# Patient Record
Sex: Female | Born: 1995 | Marital: Single
Health system: Western US, Academic
[De-identification: ages and names within clinical notes are randomized; demographics above are authoritative.]

---

## 2017-06-18 LAB — HIV-1 P24 AG AND HIV-1/2 ABS,
HIV Ag/Ab Combo: NONREACTIVE
HIV Antigen-Antibody INDEX,Ser: 0.46 IDX (ref 0.00–0.99)
HIV-1 Antibody INDEX, Serum: 0.46 IDX (ref 0.00–0.99)
HIV-1 Antibody: NONREACTIVE
HIV-1 Antigen P24 INDEX,Serum: 0.44 IDX (ref 0.00–0.99)
HIV-1 P24 Ag Screen: NONREACTIVE
HIV-2 Antibody INDEX, Serum: 0.24 IDX (ref 0.00–0.99)
HIV-2 Antibody: NONREACTIVE

## 2017-06-18 LAB — POCT URINE PREGNANCY: B-HCG Qualitative, Urine: NEGATIVE

## 2017-06-18 NOTE — Patient Instructions (Signed)
Patient Instructions by Adam PhenixMignon Cameka Rae, MD at 06/18/2017 11:00 AM     Author:  Adam PhenixMignon Analilia Geddis, MD Service:  (none) Author Type:  Resident    Filed:  06/18/2017 11:09 AM Encounter Date:  06/18/2017 Status:  Signed    Editor:  Adam PhenixMignon Arneda Sappington, MD (Resident)         -Pick up prescriptions from pharmacy  -Acne medication can be picked up once insurance is reactivated  -Once insurance reactivated, return for general health visit    ________________________________________________________   [facility]  HEALTHY TIPS    Nutrition goals:   Unless your medical provider has told you otherwise remember these healthy suggestions:    Avoid sweetened beverages including fruit juice.    Eat something nutritious for breakfast every day.    Limit fast food.    Limit portion sizes.    Aim for 5 servings of fruits and vegetables every day.     Activity goals:    Moderate to vigorous physical activity for at least 30-60 minutes per day for all who are able.    Limit screen time (TV, computer, video games) to no more than 2 hours per day and not before bedtime.    Dental:    Everyone over the age of one year should see a dentist every 6 months.    Brush twice a day for healthy teeth and gums.    Safety:    Always wear a seat belt and use a helmet when riding a bike/scooter/skateboard!     Smoke Detectors: Please make sure that your home has a working Electrical engineersmoke detector. Remember to change the battery every October.     Water temperature: Please make sure your water temperature is turned down to low-warm (less than 120 degrees) to prevent burns. If you are in a rental situation, you can ask your landlord to do this.      Windows and Stairs: If you live in a multi-story home, please make sure that you have guards on your windows and gates for your stairs. This is important to protect young children from injuries.     Poison Control: Program the Poison Help Number - (680)132-68431-831-888-4290 - into your home and cell  phones so you have it handy when you need it.  -Call with questions about household products, food and beverages, chemicals in the workplace and home, environmental toxins, drugs and medicine, and animal and insect bites and stings.

## 2017-06-18 NOTE — Progress Notes (Signed)
Progress Notes by Norlene Duel, MD at 06/18/2017 11:00 AM     Author:  Norlene Duel, MD Service:  Adolescent Medicine Author Type:  Physician    Filed:  07/10/2017 11:08 PM Encounter Date:  06/18/2017 Status:  Addendum    Editor:  Norlene Duel, MD (Physician)      Related Notes:  Original Note by Adam Phenix, MD (Resident) filed at 06/18/2017 11:41 AM         Nancy Lam is a 21 y.o. female.     Personal phone contact is [phone]. Messages Ok? Yes    Chief complaint:   Chief Complaint    Patient presents with     Follow-up       Interval History/HPI:  HPI     Started depo and received her first shot here 6 months ago. Then got two additional shots while away at school in [place]. Her last depo shot was in April (at college). She has been unhappy with this form of birth control because of weight gain and irregular bleeding. She has gained 50 lbs in the last six months (196 lbs today and 152 lbs at her last visit 06/2016). She also feels that the weight gain has been due to an unhealthy diet and eating a lot of fried foods at her college in the [place]. She also was not exercising while away at school.     Tazaria has stopped her depo shots and would like to start oral birth control pills. She is also interested in doing STD screening today for a general check. She was last sexually active 6 months ago and has not noticed any unusual symptoms.     Teigen has also noticed intermittent bilateral ankle swelling over the last 2 months (sometimes up to twice a week). She denies any injuries or trauma to the joints. The swelling is sometimes associated with pain but often not. She will put her feet up or go to sleep and it self resolves. She has not noticed any pattern to when the swelling occurs.     She has also used benzoyl peroxide in the past for acne (wants to see if it is covered - happy to pay out of pocket if needed or can wait until her insurance is reactivated).      Review of Systems    Constitutional: Positive for fatigue and unexpected weight change.   HENT: Negative.    Eyes: Negative.    Respiratory: Negative.  Negative for shortness of breath.    Gastrointestinal: Positive for constipation.   Genitourinary: Negative for dysuria, frequency, vaginal bleeding, vaginal discharge and vaginal pain.     Menstrual History: Patient's last menstrual period was 05/30/2017 (approximate).    Past Medical History:   Past Medical History:     Diagnosis  Date    Acne     History of multiple UTIs (01/2013, 03/2013) 04/03/2013    History of pregnancy 04/03/2013     No past surgical history on file.   No family history on file.     Medications:  Outpatient Prescriptions Marked as Taking for the 06/18/17 encounter (Office Visit) with Norlene Duel, MD     Medication  Sig    ibuprofen (ADVIL,MOTRIN) 600 mg tablet Take 1 tablet (600 mg) by mouth every 6 (six) hours as needed (heavy bleeding) .    loratadine (CLARITIN) 10 mg tablet Take 10 mg by mouth daily as needed for allergies .       Allergies:  Patient has no known allergies.     Social Screening:  Home: Living at home for this summer, lives with mom and two sisters.   Education: Started taking classes at [employer] and then [place] [facility] [employer] and was there this year. She was not happy with the program. So she plans to transfer from college in [place] to [employer]. Did well in the second semester but was academic probation during first semester. Wants to be a doctor and do a post-bac program.   Activities: Taking classes at [employer] doing statistics class.   Diet: Increasing green vegetables. Eating less meat. Vegan pizza. Drinking a lot of water. Exercising (running the stairs at [employer]) - is planning to exercise 2x per week. Skips breakfast sometimes.   Drugs: No tob/etoh/drug use       History    Smoking Status     Never Smoker   Smokeless Tobacco     Never Used          CRAFFT pre-screening: If 2 or more is YES, perform  CRAFFT.    Drink any alcohol (more than a few sips )? No    Smoke or eat marijuana more than 4 out of 7 days/week? No    Use anything else to get high? No  Sex: Not currently in a relationship.  LSI 6 months ago      Gender considerations: Female      Sexual orientation considerations: Straight or heterosexual   Suicide/Safety: Mood - "great", no SI/HI      Depression Screen (PHQ-2):       Little interest or pleasure in doing things? 0- Not at all      Feeling down, depressed, or hopeless? 0- Not at all      PHQ-9 needed? No, PHQ-2 score below 3    Objective:   BP 114/76   Pulse 97   Temp 36.8 C (98.2 F)   Ht 167.5 cm   Wt 88.9 kg (196 lb)   LMP 05/30/2017 (Approximate)   Breastfeeding? No   BMI 31.69 kg/m   Pain Score: 0-No pain  Location:    WT: Facility age limit for growth percentiles is 20 years.  HT: Facility age limit for growth percentiles is 20 years.  BMI: Facility age limit for growth percentiles is 20 years., Obese (> or = 95th)  BP: Growth percentile SmartLinks can only be used for patients less than 23 years old., N/A    Physical Exam   Constitutional: She appears well-developed and well-nourished.   HENT:   Right Ear: External ear normal.   Left Ear: External ear normal.   Mouth/Throat: Oropharynx is clear and moist.   Eyes: Conjunctivae are normal.   Cardiovascular: Normal rate, regular rhythm and normal heart sounds.    Pulmonary/Chest: Effort normal and breath sounds normal. She has no wheezes.   Abdominal: Soft. There is no tenderness.   Lymphadenopathy:     She has no cervical adenopathy.   Neurological: She is alert.   Skin: Skin is warm and dry.   Mild acne papules on face   Psychiatric: She has a normal mood and affect.     Labs and screening tests:  Office Visit on 06/18/2017        Component  Date Value Ref Range Status    Urine Beta HCG 06/18/2017 Negative  NEGATIVE Final       Health Education:  Anticipatory Guidance  Diet/nutrition, Confidentiality and  Contraception/EC  Assessment:  Nancy Lam  is a 21 y.o. female who presents today to transition from depo to OCPs and for routine STD screening. She has had a significant weight gain (around 50 lbs) in 6 months which is likely due to a combination of appetite increase with depo and lifestyle changes including eating fried foods and little to no exercise since starting at her new school in [place]. Her insurance has lapsed and she is using FAM-PACT today.     1. Reproductive Health  -counseled on contraception options and risks and benefits of each. She selected OCPs  -routine STD screening (HIV, RPR, CT/GC)  -counseled that OCPs will not protect against STDs and that condoms should also be used    2. Weight Gain  -Counseled on healthy eating habits and exercise for weight loss  -Once insurance is reactivated, would like her to return for weight monitoring and screening labs including cholesterol, electrolytes, possibly TSH    3. Ankle Swelling  -May be secondary to increased weight  -Bilateral symptoms, regularity of symptoms and resolution after elevating legs is reassuring against DVT    4. Acne  -Mild   -Benzoyl peroxide has worked in past for her symptoms      Plan:  Problem List Items Addressed This Visit     Acne    Relevant Medications    benzoyl peroxide 10 % cream    Screening for STDs (sexually transmitted diseases) - Primary    Relevant Orders    Chlamydia trachomatis/Neisseria gonorrhoeae by NAAT - Urine    RPR/Rapid Plasma Reagin, Screen    HIV-1 p24 Ag and HIV-1/2 Abs, 5th Gen, Serum with Reflex      Other Visit Diagnoses     Encounter for initial prescription of contraceptive pills        Relevant Medications    norgestrel-ethinyl estradiol (LO/OVRAL) 0.3-30 mg-mCg per tablet        Reproductive Health: Pill (starting today)  Immunization Status: Up to date.  Dental: N/A    Psychosocial: No issues today. Mood is good.     No Follow-up on file. Encouraged to follow up once insurance is  reactivated to do some routine screening labs following her weight gain.    Adam Phenix, MD  PL-1, pager: [phone]  06/18/2017

## 2017-06-18 NOTE — Addendum Note (Signed)
Addendum Note by Norlene Duelamita Danyella Mcginty, MD at 06/18/2017 11:00 AM     Author:  Norlene Duelamita Jorel Gravlin, MD Service:  Adolescent Medicine Author Type:  Physician    Filed:  07/10/2017 11:08 PM Encounter Date:  06/18/2017 Status:  Signed    Editor:  Norlene Duelamita Sohaib Vereen, MD (Physician)      Addended by: Norlene DuelOBINSON, Loralai Eisman on: 07/10/2017 11:08 PM        Modules accepted: Level of Service

## 2017-06-19 LAB — RPR: RPR: NONREACTIVE

## 2017-06-19 LAB — CHLAMYDIA TRACHOMATIS/NEISSERI
C. trachomatis DNA, Urine: NEGATIVE
N. gonorrhoeae, NAA, Urine: NEGATIVE

## 2017-07-20 NOTE — Progress Notes (Signed)
Progress Notes by Norlene Duelamita Fumiko Cham, MD at 07/20/2017  9:30 AM     Author:  Norlene Duelamita Telia Amundson, MD Service:  Adolescent Medicine Author Type:  Physician    Filed:  08/10/2017 10:20 PM Encounter Date:  07/20/2017 Status:  Signed    Editor:  Norlene Duelamita Solace Wendorff, MD (Physician)         Nancy MichaelisDanielle Lam is a 21 y.o. female.       Chief complaint:   Chief Complaint     Patient presents with      Appointment      RESTART DEPO       Interval History/HPI:  21 yr old female with increased vaginal discharge that is clear, +itching and ammonium order  No vaginal odor  No irregular bleeding, but hasn't had her period  No new soaps or detergents, uses her own soap (arm and hammer sensitive)    Still hasn't had her period and is NOT interested in restarting depo. Did not feel comfortable telling LVN the reason for her visit, so stated it was to restart depo.    LSI 6 months ago    Ankle swelling resolved    Eating less beef  Eating oatmeal, more fruits and vegetables  Seafood 2x/week (fish, shrimp)    Review of Systems   Constitutional: Negative.    Respiratory: Negative.    Cardiovascular: Negative.    Gastrointestinal: Negative for abdominal pain.   Genitourinary: Positive for vaginal discharge. Negative for dysuria, frequency, genital sores and pelvic pain.   All other systems reviewed and are negative.      Menstrual History: No LMP recorded.    Past Medical History:   Past Medical History:     Diagnosis  Date    Acne     History of multiple UTIs (01/2013, 03/2013) 04/03/2013    History of pregnancy 04/03/2013     No past surgical history on file.   No family history on file.     Medications:  No outpatient prescriptions have been marked as taking for the 07/20/17 encounter (Office Visit) with Norlene Duelamita Fleur Audino, MD.       Allergies:  Patient has no known allergies.     Social Screening:  HEADDSSS from 06/18/17 reviewed and unchanged, except as noted below:    Home: Still [place] apartment in [place]  Education: Going back to [employer]  [place], taking classes online at [employer]      Objective:   BP 124/79   Pulse 92   Temp 36.4 C (97.5 F) (Temporal Artery)   Resp 18   Ht 168.5 cm   Wt 87.1 kg (192 lb 0.3 oz)   Breastfeeding? No   BMI 30.68 kg/m   Pain Score: 0-No pain  Location:   WT: Facility age limit for growth percentiles is 20 years.  HT: Facility age limit for growth percentiles is 20 years.  BMI: Facility age limit for growth percentiles is 20 years., Obese (> or = 95th)  BP: Growth percentile SmartLinks can only be used for patients less than 23 years old., Normal        Physical Exam   Gen: Alert, well nourished, well-developed, NAD  CV: RRR w/o murmur  Lungs: CTAB, no w/r/c  Abd: soft, NT/ND, bowel sounds present  GU: self-swabbed  Neuro: grossly non-focal      Assessment:  Nancy Lam  is a 21 y.o. female here with vaginal discharge and pruritus, has candidal vulvovaginits.    Plan:  Problem List Items Addressed  This Visit     None      Visit Diagnoses     Yeast infection    -  Primary  Patients history, physical and wet mount findings (pseudohyphae present, no clue cells or trichomonas) consistent with candidal vulvovaginitis.  Reviewed how to prevent and treat candida.  Pt opted for fluconazole 150 mg PO x 1.  May use clotrimazole 1% topically bid for symptomatic relief if desires.        Relevant Medications    fluconazole (DIFLUCAN) tablet, dose = 150 mg (Completed)    clotrimazole (V-R CLOTRIMAZOLE VAGINAL) 1 % vaginal cream          Reproductive Health: Wants to avoid pregnancy, Plans to use condoms and Pill  Immunization Status: Risks and benefits of vaccines discussed. and Not discussed, address at next visit.  Dental: N/A    No Follow-up on file.    Norlene Duel, MD

## 2017-07-20 NOTE — Patient Instructions (Signed)
Patient Instructions by Norlene Duelamita Ashaad Gaertner, MD at 07/20/2017  9:30 AM     Author:  Norlene Duelamita Anhar Mcdermott, MD Service:  (none) Author Type:  Physician    Filed:  08/10/2017 10:19 PM Encounter Date:  07/20/2017 Status:  Signed    Editor:  Norlene Duelamita Sarahelizabeth Conway, MD (Physician)           ________________________________________________________   [facility]  HEALTHY TIPS    Nutrition goals:   Unless your medical provider has told you otherwise remember these healthy suggestions:    Avoid sweetened beverages including fruit juice.    Eat something nutritious for breakfast every day.    Limit fast food.    Limit portion sizes.    Aim for 5 servings of fruits and vegetables every day.     Activity goals:    Moderate to vigorous physical activity for at least 30-60 minutes per day for all who are able.    Limit screen time (TV, computer, video games) to no more than 2 hours per day and not before bedtime.    Dental:    Everyone over the age of one year should see a dentist every 6 months.    Brush twice a day for healthy teeth and gums.    Safety:    Always wear a seat belt and use a helmet when riding a bike/scooter/skateboard!     Smoke Detectors: Please make sure that your home has a working Electrical engineersmoke detector. Remember to change the battery every October.     Water temperature: Please make sure your water temperature is turned down to low-warm (less than 120 degrees) to prevent burns. If you are in a rental situation, you can ask your landlord to do this.      Windows and Stairs: If you live in a multi-story home, please make sure that you have guards on your windows and gates for your stairs. This is important to protect young children from injuries.     Poison Control: Program the Poison Help Number - 313 209 89701-828-370-3256 - into your home and cell phones so you have it handy when you need it.  -Call with questions about household products, food and beverages, chemicals in the workplace and home,  environmental toxins, drugs and medicine, and animal and insect bites and stings.

## 2017-08-09 DIAGNOSIS — N899 Noninflammatory disorder of vagina, unspecified: Secondary | ICD-10-CM | POA: Diagnosis not present

## 2017-08-16 ENCOUNTER — Emergency Department (HOSPITAL_COMMUNITY)
Admission: EM | Admit: 2017-08-16 | Discharge: 2017-08-16 | Disposition: A | Discharge status: Home / Self Care | Payer: BLUE CROSS/BLUE SHIELD | Attending: Emergency Medicine | Admitting: Emergency Medicine

## 2017-08-16 ENCOUNTER — Emergency Department (HOSPITAL_COMMUNITY): Payer: BLUE CROSS/BLUE SHIELD

## 2017-08-16 ENCOUNTER — Encounter (HOSPITAL_COMMUNITY): Payer: Self-pay | Admitting: Emergency Medicine

## 2017-08-16 DIAGNOSIS — M791 Myalgia, unspecified site: Secondary | ICD-10-CM

## 2017-08-16 DIAGNOSIS — Y9389 Activity, other specified: Secondary | ICD-10-CM | POA: Insufficient documentation

## 2017-08-16 DIAGNOSIS — Y998 Other external cause status: Secondary | ICD-10-CM | POA: Diagnosis not present

## 2017-08-16 DIAGNOSIS — M542 Cervicalgia: Secondary | ICD-10-CM | POA: Diagnosis not present

## 2017-08-16 DIAGNOSIS — Y92410 Unspecified street and highway as the place of occurrence of the external cause: Secondary | ICD-10-CM | POA: Diagnosis not present

## 2017-08-16 MED ORDER — METHOCARBAMOL 500 MG PO TABS: 500.0000 mg | ORAL_TABLET | Freq: Two times a day (BID) | ORAL | 0 refills | Status: DC | PRN

## 2017-08-16 MED ORDER — IBUPROFEN 800 MG PO TABS: 800.0000 mg | ORAL_TABLET | Freq: Three times a day (TID) | ORAL | 0 refills | Status: DC | PRN

## 2017-08-16 MED ORDER — IBUPROFEN 800 MG PO TABS
800.0000 mg | ORAL_TABLET | Freq: Once | ORAL | Status: AC
  Administered 2017-08-16: 800 mg via ORAL
  Filled 2017-08-16: qty 1
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
## 2017-08-22 DIAGNOSIS — Z3009 Encounter for other general counseling and advice on contraception: Secondary | ICD-10-CM | POA: Diagnosis not present

## 2017-09-22 DIAGNOSIS — H52223 Regular astigmatism, bilateral: Secondary | ICD-10-CM | POA: Diagnosis not present

## 2017-10-22 DIAGNOSIS — Z0000 Encounter for general adult medical examination without abnormal findings: Secondary | ICD-10-CM | POA: Diagnosis not present

## 2017-10-23 DIAGNOSIS — Z113 Encounter for screening for infections with a predominantly sexual mode of transmission: Secondary | ICD-10-CM | POA: Diagnosis not present

## 2017-11-04 ENCOUNTER — Emergency Department (HOSPITAL_COMMUNITY)
Admission: EM | Admit: 2017-11-04 | Discharge: 2017-11-04 | Disposition: A | Discharge status: Home / Self Care | Payer: BLUE CROSS/BLUE SHIELD | Attending: Emergency Medicine | Admitting: Emergency Medicine

## 2017-11-04 ENCOUNTER — Other Ambulatory Visit: Payer: Self-pay

## 2017-11-04 ENCOUNTER — Encounter (HOSPITAL_COMMUNITY): Payer: Self-pay | Admitting: *Deleted

## 2017-11-04 DIAGNOSIS — Z79899 Other long term (current) drug therapy: Secondary | ICD-10-CM | POA: Diagnosis not present

## 2017-11-04 DIAGNOSIS — H5712 Ocular pain, left eye: Secondary | ICD-10-CM | POA: Diagnosis present

## 2017-11-04 DIAGNOSIS — H109 Unspecified conjunctivitis: Secondary | ICD-10-CM | POA: Insufficient documentation

## 2017-11-04 MED ORDER — POLYMYXIN B-TRIMETHOPRIM 10000-0.1 UNIT/ML-% OP SOLN: 1.0000 [drp] | OPHTHALMIC | 0 refills | Status: AC

## 2017-11-04 NOTE — Discharge Instructions (Signed)
I suspect he had viral conjunctivitis (pinkeye).  Typical symptoms include eyelid swelling/puffiness, red eye, tearing and crusting.  There may be some pain but is generally more irritation/itching.  Wash your hands frequently.  Use cold compresses.  Continue to take Benadryl as needed.  You were also given a prescription for some antibiotic eyedrops in case of a bacterial cause, but I think this is less likely.

## 2017-11-04 NOTE — ED Provider Notes (Signed)
[facility] Provider Note   CSN: [id number] Arrival date & time: 11/04/17  [id number]     History   Chief Complaint Chief Complaint  Patient presents with  . Eye Pain    HPI Joanna Long is a 21 y.o. female.  HPI   21 year old female with left eye swelling and irritation.  Onset 2 days ago.  No real pain, more irritation/itching.  Tearing and crusting of her eyelids.  She does not wear contacts.  No change in visual acuity. No corrective lenses.  History reviewed. No pertinent past medical history.  There are no active problems to display for this patient.   History reviewed. No pertinent surgical history.  OB History    No data available       Home Medications    Prior to Admission medications   Medication Sig Start Date End Date Taking? Authorizing Provider  ibuprofen (ADVIL,MOTRIN) 800 MG tablet Take 1 tablet (800 mg total) by mouth every 8 (eight) hours as needed. 08/16/17   Ward, Chase PicketJaime Pilcher, PA-C  methocarbamol (ROBAXIN) 500 MG tablet Take 1 tablet (500 mg total) by mouth 2 (two) times daily as needed for muscle spasms. 08/16/17   Ward, Chase PicketJaime Pilcher, PA-C  trimethoprim-polymyxin b (POLYTRIM) ophthalmic solution Place 1 drop into the left eye every 4 (four) hours for 5 days. 11/04/17 11/09/17  Raeford RazorKohut, Amery Minasyan, MD    Family History No family history on file.  Social History Social History   Tobacco Use  . Smoking status: Never Smoker  . Smokeless tobacco: Never Used  Substance Use Topics  . Alcohol use: No    Frequency: Never  . Drug use: No     Allergies   Patient has no known allergies.   Review of Systems Review of Systems  All systems reviewed and negative, other than as noted in HPI.  Physical Exam Updated Vital Signs BP 131/79 (BP Location: Right Arm)   Pulse 82   Temp 98.2 F (36.8 C) (Oral)   Resp 16   Ht 5\' 6"  (1.676 m)   Wt 88.9 kg (196 lb)   LMP 10/26/2017   SpO2 100%   BMI 31.64  kg/m   Physical Exam  Constitutional: She appears well-developed and well-nourished. No distress.  HENT:  Head: Normocephalic and atraumatic.  Eyes: Right eye exhibits no discharge. Left eye exhibits no discharge.  Left eyelid with mild swelling.  Conjunctival injection.  Some crusting noted in the eyelashes.  No proptosis.  No pain with eye movement.  Anterior chambers clear.  Pupils are equally round reactive to light.  Neck: Neck supple.  Cardiovascular: Normal rate, regular rhythm and normal heart sounds. Exam reveals no gallop and no friction rub.  No murmur heard. Pulmonary/Chest: Effort normal and breath sounds normal. No respiratory distress.  Abdominal: Soft. She exhibits no distension. There is no tenderness.  Musculoskeletal: She exhibits no edema or tenderness.  Neurological: She is alert.  Skin: Skin is warm and dry.  Psychiatric: She has a normal mood and affect. Her behavior is normal. Thought content normal.  Nursing note and vitals reviewed.    ED Treatments / Results  Labs (all labs ordered are listed, but only abnormal results are displayed) Labs Reviewed - No data to display  EKG  EKG Interpretation None       Radiology No results found.  Procedures Procedures (including critical care time)  Medications Ordered in ED Medications - No data to display   Initial  Impression / Assessment and Plan / ED Course  I have reviewed the triage vital signs and the nursing notes.  Pertinent labs & imaging results that were available during my care of the patient were reviewed by me and considered in my medical decision making (see chart for details).     21 year old female with likely viral conjunctivitis to left eye.  Will cover with antibiotic drops for possible bacterial conjunctivitis, although I feel this is less likely.  Cool compresses.  Can try taking Benadryl as needed as well.  Return precautions were discussed.  She does not wear corrective  lenses.  Final Clinical Impressions(s) / ED Diagnoses   Final diagnoses:  Conjunctivitis of left eye, unspecified conjunctivitis type    ED Discharge Orders        Ordered    trimethoprim-polymyxin b (POLYTRIM) ophthalmic solution  Every 4 hours     11/04/17 1056       Raeford RazorKohut, Abdelrahman Nair, MD 11/04/17 1102

## 2017-11-04 NOTE — ED Triage Notes (Signed)
Pt with L eye pain, swelling, redness and drainage for several days.

## 2017-11-05 DIAGNOSIS — H1012 Acute atopic conjunctivitis, left eye: Secondary | ICD-10-CM | POA: Diagnosis not present

## 2017-11-22 DIAGNOSIS — N899 Noninflammatory disorder of vagina, unspecified: Secondary | ICD-10-CM | POA: Diagnosis not present

## 2017-11-22 DIAGNOSIS — Z113 Encounter for screening for infections with a predominantly sexual mode of transmission: Secondary | ICD-10-CM | POA: Diagnosis not present

## 2017-12-19 DIAGNOSIS — J029 Acute pharyngitis, unspecified: Secondary | ICD-10-CM | POA: Diagnosis not present

## 2018-01-24 DIAGNOSIS — Z3202 Encounter for pregnancy test, result negative: Secondary | ICD-10-CM | POA: Diagnosis not present

## 2018-01-24 DIAGNOSIS — R1084 Generalized abdominal pain: Secondary | ICD-10-CM | POA: Diagnosis not present

## 2018-01-31 DIAGNOSIS — R350 Frequency of micturition: Secondary | ICD-10-CM | POA: Diagnosis not present

## 2018-01-31 DIAGNOSIS — K5900 Constipation, unspecified: Secondary | ICD-10-CM | POA: Diagnosis not present

## 2018-01-31 DIAGNOSIS — Z113 Encounter for screening for infections with a predominantly sexual mode of transmission: Secondary | ICD-10-CM | POA: Diagnosis not present

## 2018-04-01 DIAGNOSIS — Z113 Encounter for screening for infections with a predominantly sexual mode of transmission: Secondary | ICD-10-CM | POA: Diagnosis not present

## 2018-04-01 DIAGNOSIS — J309 Allergic rhinitis, unspecified: Secondary | ICD-10-CM | POA: Diagnosis not present

## 2018-04-26 DIAGNOSIS — R300 Dysuria: Secondary | ICD-10-CM | POA: Diagnosis not present

## 2018-05-30 DIAGNOSIS — L309 Dermatitis, unspecified: Secondary | ICD-10-CM | POA: Diagnosis not present

## 2018-09-13 DIAGNOSIS — R102 Pelvic and perineal pain: Secondary | ICD-10-CM | POA: Diagnosis not present

## 2018-10-11 DIAGNOSIS — J069 Acute upper respiratory infection, unspecified: Secondary | ICD-10-CM | POA: Diagnosis not present

## 2019-03-17 DIAGNOSIS — Z113 Encounter for screening for infections with a predominantly sexual mode of transmission: Secondary | ICD-10-CM | POA: Diagnosis not present

## 2019-06-06 DIAGNOSIS — N941 Unspecified dyspareunia: Secondary | ICD-10-CM | POA: Diagnosis not present

## 2019-06-06 DIAGNOSIS — Z113 Encounter for screening for infections with a predominantly sexual mode of transmission: Secondary | ICD-10-CM | POA: Diagnosis not present

## 2019-06-06 DIAGNOSIS — B373 Candidiasis of vulva and vagina: Secondary | ICD-10-CM | POA: Diagnosis not present

## 2019-06-06 DIAGNOSIS — Z6824 Body mass index (BMI) 24.0-24.9, adult: Secondary | ICD-10-CM | POA: Diagnosis not present

## 2019-06-06 DIAGNOSIS — Z01419 Encounter for gynecological examination (general) (routine) without abnormal findings: Secondary | ICD-10-CM | POA: Diagnosis not present

## 2019-07-14 DIAGNOSIS — L503 Dermatographic urticaria: Secondary | ICD-10-CM | POA: Diagnosis not present
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
## 2019-09-18 DIAGNOSIS — J3089 Other allergic rhinitis: Secondary | ICD-10-CM | POA: Diagnosis not present

## 2019-09-18 DIAGNOSIS — R21 Rash and other nonspecific skin eruption: Secondary | ICD-10-CM | POA: Diagnosis not present

## 2019-09-18 DIAGNOSIS — J301 Allergic rhinitis due to pollen: Secondary | ICD-10-CM | POA: Diagnosis not present

## 2019-09-18 DIAGNOSIS — J3081 Allergic rhinitis due to animal (cat) (dog) hair and dander: Secondary | ICD-10-CM | POA: Diagnosis not present

## 2019-10-18 DIAGNOSIS — F4321 Adjustment disorder with depressed mood: Secondary | ICD-10-CM | POA: Diagnosis not present

## 2019-10-25 DIAGNOSIS — F4321 Adjustment disorder with depressed mood: Secondary | ICD-10-CM | POA: Diagnosis not present

## 2019-11-15 DIAGNOSIS — F4321 Adjustment disorder with depressed mood: Secondary | ICD-10-CM | POA: Diagnosis not present

## 2020-10-29 ENCOUNTER — Emergency Department (HOSPITAL_COMMUNITY)
Admission: EM | Admit: 2020-10-29 | Discharge: 2020-10-29 | Disposition: A | Discharge status: Home / Self Care | Payer: No Typology Code available for payment source | Attending: Emergency Medicine | Admitting: Emergency Medicine

## 2020-10-29 ENCOUNTER — Encounter (HOSPITAL_COMMUNITY): Payer: Self-pay | Admitting: Emergency Medicine

## 2020-10-29 ENCOUNTER — Other Ambulatory Visit: Payer: Self-pay

## 2020-10-29 ENCOUNTER — Emergency Department (HOSPITAL_COMMUNITY): Payer: No Typology Code available for payment source

## 2020-10-29 DIAGNOSIS — R079 Chest pain, unspecified: Secondary | ICD-10-CM | POA: Diagnosis not present

## 2020-10-29 DIAGNOSIS — M546 Pain in thoracic spine: Secondary | ICD-10-CM

## 2020-10-29 MED ORDER — METHOCARBAMOL 500 MG PO TABS: 500.0000 mg | ORAL_TABLET | Freq: Two times a day (BID) | ORAL | 0 refills | Status: DC

## 2020-10-29 MED ORDER — IBUPROFEN 400 MG PO TABS
600.0000 mg | ORAL_TABLET | Freq: Once | ORAL | Status: AC
  Administered 2020-10-29: 600 mg via ORAL
  Filled 2020-10-29: qty 1

## 2020-10-29 MED ORDER — IBUPROFEN 600 MG PO TABS: 600.0000 mg | ORAL_TABLET | Freq: Four times a day (QID) | ORAL | 0 refills | Status: DC | PRN
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
## 2020-10-29 NOTE — Discharge Instructions (Signed)

## 2020-10-29 NOTE — ED Notes (Signed)
Pt discharge instructions reviewed with the patient. The patient verbalized understanding of instructions. Pt discharged. 

## 2021-07-19 ENCOUNTER — Ambulatory Visit: Payer: No Typology Code available for payment source | Admitting: Podiatry

## 2021-07-20 ENCOUNTER — Ambulatory Visit: Payer: No Typology Code available for payment source | Admitting: Podiatry

## 2021-07-21 ENCOUNTER — Ambulatory Visit: Payer: No Typology Code available for payment source | Admitting: Podiatry

## 2021-07-26 ENCOUNTER — Ambulatory Visit: Payer: No Typology Code available for payment source | Admitting: Podiatry

## 2021-07-29 ENCOUNTER — Telehealth: Payer: Self-pay | Admitting: *Deleted

## 2021-07-29 ENCOUNTER — Ambulatory Visit (INDEPENDENT_AMBULATORY_CARE_PROVIDER_SITE_OTHER): Payer: BC Managed Care – PPO | Admitting: Podiatry

## 2021-07-29 ENCOUNTER — Other Ambulatory Visit: Payer: Self-pay

## 2021-07-29 ENCOUNTER — Encounter: Payer: Self-pay | Admitting: Podiatry

## 2021-07-29 DIAGNOSIS — B351 Tinea unguium: Secondary | ICD-10-CM

## 2021-07-29 DIAGNOSIS — L600 Ingrowing nail: Secondary | ICD-10-CM | POA: Diagnosis not present

## 2021-07-29 NOTE — Telephone Encounter (Signed)
Error

## 2021-07-29 NOTE — Progress Notes (Signed)
Subjective:   Patient ID: Joanna Long, female   DOB: 25 y.o.   MRN: [id number]   HPI 25 year old female presents the office today for concerns of her right big toe, fifth toenails becoming thickened discolored and dark in color.  Is been getting worse over last 2 years.  She said they do cause discomfort at times and she gets some swelling and irritation at the base of the nail and the cuticle.  She tried multiple over-the-counter treatment without significant improvement.  Occasionally her feet also itch.  The nails are brittle she reports.  She has no other concerns.   Review of Systems  All other systems reviewed and are negative.  History reviewed. No pertinent past medical history.  History reviewed. No pertinent surgical history.  No current outpatient medications on file.  Allergies  Allergen Reactions   Soy Allergy Itching    HIVES          Objective:  Physical Exam  General: AAO x3, NAD  Dermatological: Right hallux, fifth digit nails are hypertrophic, dystrophic with darkened discoloration of the nails.  Upon removal of the nails there is no hyperpigmentation in the nail bed or surrounding skin.  Mild edema on the proximal nail fold mostly on the hallux.  No drainage or pus or any erythema.  Vascular: Dorsalis Pedis artery and Posterior Tibial artery pedal pulses are 2/4 bilateral with immedate capillary fill time. There is no pain with calf compression, swelling, warmth, erythema.   Neruologic: Grossly intact via light touch bilateral.   Musculoskeletal: No gross boney pedal deformities bilateral. No pain, crepitus, or limitation noted with foot and ankle range of motion bilateral. Muscular strength 5/5 in all groups tested bilateral.  Gait: Unassisted, Nonantalgic.       Assessment:   Onychodystrophy right hallux, fifth digit nails     Plan:  -Treatment options discussed including all alternatives, risks, and complications -Etiology of symptoms  were discussed -We discussed treatment options for the nails.  She likes to have the nails removed. -At this time, discussed total nail removal without chemical matricectomy to the right first, fifth digit toenails. Risks and complications were discussed with the patient for which they understand and  verbally consent to the procedure. Under sterile conditions a total of 3 mL of a mixture of 2% lidocaine plain and 0.5% Marcaine plain was infiltrated in a digital block fashion. Once anesthetized, the skin was prepped in sterile fashion. A tourniquet was then applied. Next the right first, fifth digit nails were removed in total making sure to remove the entire offending nail borders. Once the nail was removed, the area was debrided and the underlying skin was intact. The area was irrigated and hemostasis was obtained.  A dry sterile dressing was applied. After application of the dressing the tourniquet was removed and there is found to be an immediate capillary refill time to the digit. The patient tolerated the procedure well any complications. Post procedure instructions were discussed the patient for which he verbally understood. Follow-up in one week for nail check or sooner if any problems are to arise. Discussed Long/symptoms of worsening infection and directed to call the office immediately should any occur or go directly to the emergency room. In the meantime, encouraged to call the office with any questions, concerns, changes symptoms. -Nails sent to [facility] for culture/pathology  Vivi Barrack DPM

## 2021-07-29 NOTE — Patient Instructions (Signed)

## 2021-08-02 NOTE — Addendum Note (Signed)
Addended by: Hadley Pen R on: 08/02/2021 08:26 AM   Modules accepted: Orders

## 2021-08-19 ENCOUNTER — Encounter: Payer: Self-pay | Admitting: Podiatry

## 2021-08-19 ENCOUNTER — Other Ambulatory Visit: Payer: Self-pay

## 2021-08-19 ENCOUNTER — Ambulatory Visit (INDEPENDENT_AMBULATORY_CARE_PROVIDER_SITE_OTHER): Payer: BC Managed Care – PPO | Admitting: Podiatry

## 2021-08-19 DIAGNOSIS — B351 Tinea unguium: Secondary | ICD-10-CM

## 2021-08-19 DIAGNOSIS — L600 Ingrowing nail: Secondary | ICD-10-CM | POA: Diagnosis not present

## 2021-08-19 MED ORDER — EFINACONAZOLE 10 % EX SOLN: 1.0000 [drp] | Freq: Every day | CUTANEOUS | 11 refills | Status: AC

## 2021-08-19 NOTE — Patient Instructions (Signed)
Efinaconazole Topical Solution What is this medication? EFINACONAZOLE (e FEE na KON a zole) is an antifungal medicine. It is used to treat certain kinds of fungal infections of the toenail. This medicine may be used for other purposes; ask your health care provider or pharmacist if you have questions. COMMON BRAND NAME(S): JUBLIA What should I tell my care team before I take this medication? They need to know if you have any of these conditions: an unusual or allergic reaction to efinaconazole, other medicines, foods, dyes or preservatives pregnant or trying to get pregnant breast-feeding How should I use this medication? This medicine is for external use only. Do not take by mouth. Follow the directions on the label. Wash hands before and after use. Apply this medicine using the provided brush to cover the entire toenail. Do not use your medicine more often than directed. Finish the full course prescribed by your doctor or health care professional even if you think your condition is better. Do not stop using except on the advice of your doctor or health care professional. Talk to your pediatrician regarding the use of this medicine in children. While this drug may be prescribed for children as young as 6 years for selected conditions, precautions do apply. Overdosage: If you think you have taken too much of this medicine contact a poison control center or emergency room at once. NOTE: This medicine is only for you. Do not share this medicine with others. What if I miss a dose? If you miss a dose, use it as soon as you can. If it is almost time for your next dose, use only that dose. Do not use double or extra doses. What may interact with this medication? Interactions have not been studied. Do not use any other nail products (i.e., nail polish, pedicures) during treatment with this medicine. This list may not describe all possible interactions. Give your health care provider a list of all the  medicines, herbs, non-prescription drugs, or dietary supplements you use. Also tell them if you smoke, drink alcohol, or use illegal drugs. Some items may interact with your medicine. What should I watch for while using this medication? Do not get this medicine in your eyes. If you do, rinse out with plenty of cool tap water. Tell your doctor or health care professional if your symptoms do not start to get better or if they get worse. Wait for at least 10 minutes after bathing before applying this medication. After bathing, make sure that your feet are very dry. Fungal infections like moist conditions. Do not walk around barefoot. To help prevent reinfection, wear freshly washed cotton, not synthetic clothing. Tell your doctor or health care professional if you develop sores or blisters that do not heal properly. If your nail infection returns after you stop using this medicine, contact your doctor or health care professional. What side effects may I notice from receiving this medication? Side effects that you should report to your doctor or health care professional as soon as possible: allergic reactions like skin rash, itching or hives, swelling of the face, lips, or tongue ingrown toenail Side effects that usually do not require medical attention (report to your doctor or health care professional if they continue or are bothersome): mild skin irritation, burning, or itching This list may not describe all possible side effects. Call your doctor for medical advice about side effects. You may report side effects to FDA at 1-800-FDA-1088. Where should I keep my medication? Keep out of the   reach of children. Store at room temperature between 20 and 25 degrees C (68 and 77 degrees F). Keep this medicine in the original container. Throw away any unused medicine after the expiration date. This medicine is flammable. Avoid exposure to heat, fire, flame, and smoking. NOTE: This sheet is a summary. It may  not cover all possible information. If you have questions about this medicine, talk to your doctor, pharmacist, or health care provider.  2022 Elsevier/Gold Standard (2019-04-07 16:14:11)  

## 2021-08-24 DIAGNOSIS — B351 Tinea unguium: Secondary | ICD-10-CM | POA: Insufficient documentation

## 2021-08-24 NOTE — Progress Notes (Signed)
Subjective: 25 year old female presents to the office today for further evaluation after undergoing right first and fifth digit toenail no avulsions.  She states the areas have dried up and not having any pain swelling redness or any drainage.  She has no concerns otherwise.  Objective: AAO x3, NAD DP/PT pulses palpable bilaterally, CRT less than 3 seconds Status post right first and fifth digit toenail avulsions which are healed.  No open lesions.  No edema, erythema, drainage or concerns of infection.  Remaining nails also appear to be dystrophic with yellow and brown discoloration but no pain.  No swelling redness or drainage. No pain with calf compression, swelling, warmth, erythema  Assessment: Onychomycosis  Plan: -All treatment options discussed with the patient including all alternatives, risks, complications.  -From a procedure standpoint she is healed.  We discussed the nail culture, biopsy results which did reveal onychomycosis.  After discussion we discussed oral and topical treatments.  She wants to start with topical.  We will try for Jublia which I ordered today.  Discussed success rates, side effects and duration of use. -Patient encouraged to call the office with any questions, concerns, change in symptoms.   Vivi Barrack DPM

## 2022-04-17 ENCOUNTER — Encounter (HOSPITAL_BASED_OUTPATIENT_CLINIC_OR_DEPARTMENT_OTHER): Payer: Self-pay

## 2022-04-17 ENCOUNTER — Other Ambulatory Visit: Payer: Self-pay

## 2022-04-17 ENCOUNTER — Emergency Department (HOSPITAL_BASED_OUTPATIENT_CLINIC_OR_DEPARTMENT_OTHER): Payer: BC Managed Care – PPO

## 2022-04-17 ENCOUNTER — Emergency Department (HOSPITAL_BASED_OUTPATIENT_CLINIC_OR_DEPARTMENT_OTHER)
Admission: EM | Admit: 2022-04-17 | Discharge: 2022-04-17 | Disposition: A | Discharge status: Home / Self Care | Payer: BC Managed Care – PPO | Attending: Emergency Medicine | Admitting: Emergency Medicine

## 2022-04-17 DIAGNOSIS — K8020 Calculus of gallbladder without cholecystitis without obstruction: Secondary | ICD-10-CM

## 2022-04-17 DIAGNOSIS — R1011 Right upper quadrant pain: Secondary | ICD-10-CM | POA: Diagnosis present

## 2022-04-17 LAB — URINALYSIS, ROUTINE W REFLEX MICROSCOPIC
Bilirubin Urine: NEGATIVE
Glucose, UA: NEGATIVE mg/dL
Hgb urine dipstick: NEGATIVE
Ketones, ur: NEGATIVE mg/dL
Leukocytes,Ua: NEGATIVE
Nitrite: NEGATIVE
Protein, ur: NEGATIVE mg/dL
Specific Gravity, Urine: 1.013 (ref 1.005–1.030)
pH: 5.5 (ref 5.0–8.0)

## 2022-04-17 LAB — CBC WITH DIFFERENTIAL/PLATELET
Abs Immature Granulocytes: 0.02 10*3/uL (ref 0.00–0.07)
Basophils Absolute: 0 10*3/uL (ref 0.0–0.1)
Basophils Relative: 0 %
Eosinophils Absolute: 0.1 10*3/uL (ref 0.0–0.5)
Eosinophils Relative: 1 %
HCT: 38 % (ref 36.0–46.0)
Hemoglobin: 12.1 g/dL (ref 12.0–15.0)
Immature Granulocytes: 0 %
Lymphocytes Relative: 35 %
Lymphs Abs: 3.2 10*3/uL (ref 0.7–4.0)
MCH: 27.1 pg (ref 26.0–34.0)
MCHC: 31.8 g/dL (ref 30.0–36.0)
MCV: 85 fL (ref 80.0–100.0)
Monocytes Absolute: 0.5 10*3/uL (ref 0.1–1.0)
Monocytes Relative: 6 %
Neutro Abs: 5.2 10*3/uL (ref 1.7–7.7)
Neutrophils Relative %: 58 %
Platelets: 188 10*3/uL (ref 150–400)
RBC: 4.47 MIL/uL (ref 3.87–5.11)
RDW: 14.5 % (ref 11.5–15.5)
WBC: 9 10*3/uL (ref 4.0–10.5)
nRBC: 0 % (ref 0.0–0.2)

## 2022-04-17 LAB — COMPREHENSIVE METABOLIC PANEL
ALT: 9 U/L (ref 0–44)
AST: 14 U/L — ABNORMAL LOW (ref 15–41)
Albumin: 4.4 g/dL (ref 3.5–5.0)
Alkaline Phosphatase: 64 U/L (ref 38–126)
Anion gap: 9 (ref 5–15)
BUN: 15 mg/dL (ref 6–20)
CO2: 25 mmol/L (ref 22–32)
Calcium: 9.5 mg/dL (ref 8.9–10.3)
Chloride: 102 mmol/L (ref 98–111)
Creatinine, Ser: 0.77 mg/dL (ref 0.44–1.00)
GFR, Estimated: >60 mL/min (ref >60)
Glucose, Bld: 94 mg/dL (ref 70–99)
Potassium: 4 mmol/L (ref 3.5–5.1)
Sodium: 136 mmol/L (ref 135–145)
Total Bilirubin: 0.2 mg/dL — ABNORMAL LOW (ref 0.3–1.2)
Total Protein: 7.8 g/dL (ref 6.5–8.1)

## 2022-04-17 LAB — PREGNANCY, URINE: Preg Test, Ur: NEGATIVE

## 2022-04-17 LAB — LIPASE, BLOOD: Lipase: 25 U/L (ref 11–51)

## 2022-04-17 MED ORDER — ONDANSETRON HCL 4 MG/2ML IJ SOLN
4.0000 mg | Freq: Once | INTRAMUSCULAR | Status: AC
Start: 2022-04-17 — End: 2022-04-17
  Administered 2022-04-17: 4 mg via INTRAVENOUS
  Filled 2022-04-17: qty 2

## 2022-04-17 MED ORDER — KETOROLAC TROMETHAMINE 15 MG/ML IJ SOLN
15.0000 mg | Freq: Once | INTRAMUSCULAR | Status: AC
  Administered 2022-04-17: 15 mg via INTRAVENOUS
  Filled 2022-04-17: qty 1

## 2022-04-17 MED ORDER — MORPHINE SULFATE (PF) 4 MG/ML IV SOLN
4.0000 mg | Freq: Once | INTRAVENOUS | Status: AC
  Administered 2022-04-17: 4 mg via INTRAVENOUS
  Filled 2022-04-17: qty 1

## 2022-04-17 MED ORDER — IBUPROFEN 600 MG PO TABS: 600.0000 mg | ORAL_TABLET | Freq: Four times a day (QID) | ORAL | 0 refills | Status: AC | PRN

## 2022-04-17 NOTE — ED Notes (Signed)
US at the Bedside. °

## 2022-04-17 NOTE — ED Provider Notes (Signed)
[facility] EMERGENCY DEPT Provider Note   CSN: [id number] Arrival date & time: 04/17/22  1950     History  Chief Complaint  Patient presents with   Abdominal Pain    Joanna Long is a 26 y.o. female.   Abdominal Pain Associated symptoms: nausea   Associated symptoms: no chest pain, no chills, no cough, no diarrhea, no dysuria, no fever, no shortness of breath and no vomiting    26 year old female presents emergency department with 2 to 3-day history of right-sided abdominal pain.  Patient states that pain began Saturday night but she does not member what she was doing when the pain began.  Pain was minimal then but then worsened from Sunday on.  This current episode has lasted for the last hours.  Patient describes the pain as sharp and stabbing in her right upper quadrant with radiation to her right lower back.  She has associated nausea.  Pain is exacerbated with leaning back.  Pain is relieved with rest.  She has tried [employer] as well as [employer] on her skin with minimal to no relief.  She denies chest pain, shortness of breath, vomiting/diarrhea, urinary/vaginal symptoms, change in bowel habits, hematochezia, melena.  She has no significant past medical history  Home Medications Prior to Admission medications   Medication Sig Start Date End Date Taking? Authorizing Provider  ibuprofen (ADVIL) 600 MG tablet Take 1 tablet (600 mg total) by mouth every 6 (six) hours as needed. 04/17/22  Yes Dion Saucier A, PA  Efinaconazole 10 % SOLN Apply 1 drop topically daily. 08/19/21   Trula Slade, DPM      Allergies    Soy allergy    Review of Systems   Review of Systems  Constitutional:  Negative for chills and fever.  Respiratory:  Negative for cough and shortness of breath.   Cardiovascular:  Negative for chest pain and palpitations.  Gastrointestinal:  Positive for abdominal pain and nausea. Negative for blood in stool, diarrhea and vomiting.   Genitourinary:  Negative for dysuria and urgency.  Musculoskeletal:  Negative for arthralgias.  Skin:  Negative for color change, pallor, rash and wound.  Neurological:  Negative for dizziness and seizures.  All other systems reviewed and are negative.  Physical Exam Updated Vital Signs BP 131/76 (BP Location: Right Arm)   Pulse 96   Temp 98.2 F (36.8 C) (Oral)   Resp 18   Ht 5\' 6"  (1.676 m)   Wt 93 kg   LMP 04/08/2022 (Exact Date) Comment: negative U-preg on 04/17/22  SpO2 100%   BMI 33.09 kg/m  Physical Exam Constitutional:      General: She is not in acute distress.    Appearance: She is well-developed. She is obese. She is not ill-appearing, toxic-appearing or diaphoretic.  HENT:     Head: Normocephalic and atraumatic.     Mouth/Throat:     Mouth: Mucous membranes are moist.     Pharynx: Oropharynx is clear.  Eyes:     Extraocular Movements: Extraocular movements intact.  Cardiovascular:     Rate and Rhythm: Normal rate and regular rhythm.     Heart sounds: Normal heart sounds.  Pulmonary:     Effort: Pulmonary effort is normal.     Breath sounds: Normal breath sounds.  Abdominal:     General: Abdomen is flat and protuberant. Bowel sounds are normal. There is no distension.     Palpations: Abdomen is soft. There is no mass.  Tenderness: There is abdominal tenderness. There is right CVA tenderness and guarding. There is no rebound. Positive signs include Murphy's sign. Negative signs include Rovsing's sign, McBurney's sign and psoas sign.     Hernia: No hernia is present.  Skin:    General: Skin is warm and dry.     Capillary Refill: Capillary refill takes less than 2 seconds.  Neurological:     General: No focal deficit present.     Mental Status: She is alert and oriented to person, place, and time.  Psychiatric:        Mood and Affect: Mood normal.        Behavior: Behavior normal.    ED Results / Procedures / Treatments   Labs (all labs ordered are  listed, but only abnormal results are displayed) Labs Reviewed  URINALYSIS, ROUTINE W REFLEX MICROSCOPIC - Abnormal; Notable for the following components:      Result Value   Color, Urine COLORLESS (*)    All other components within normal limits  COMPREHENSIVE METABOLIC PANEL - Abnormal; Notable for the following components:   AST 14 (*)    Total Bilirubin 0.2 (*)    All other components within normal limits  PREGNANCY, URINE  CBC WITH DIFFERENTIAL/PLATELET  LIPASE, BLOOD    EKG None  Radiology CT Renal Stone Study  Result Date: 04/17/2022 CLINICAL DATA:  Right flank pain.  Concern for kidney stone. EXAM: CT ABDOMEN AND PELVIS WITHOUT CONTRAST TECHNIQUE: Multidetector CT imaging of the abdomen and pelvis was performed following the standard protocol without IV contrast. RADIATION DOSE REDUCTION: This exam was performed according to the departmental dose-optimization program which includes automated exposure control, adjustment of the mA and/or kV according to patient size and/or use of iterative reconstruction technique. COMPARISON:  Right upper quadrant ultrasound dated 04/17/2022. FINDINGS: Evaluation of this exam is limited in the absence of intravenous contrast. Lower chest: The visualized lung bases are clear. No intra-abdominal free air or free fluid. Hepatobiliary: The liver is unremarkable. No intrahepatic biliary dilatation. Tiny gallstone. No pericholecystic fluid or evidence of acute cholecystitis by CT. Pancreas: Unremarkable. No pancreatic ductal dilatation or surrounding inflammatory changes. Spleen: Normal in size without focal abnormality. Adrenals/Urinary Tract: The adrenal glands are unremarkable. The kidneys, visualized ureters, and urinary bladder appear unremarkable. Stomach/Bowel: Moderate stool throughout the colon. Several diverticula in the ascending colon without active inflammatory changes. There is no bowel obstruction or active inflammation. The appendix is normal.  Vascular/Lymphatic: The abdominal aorta and IVC are unremarkable. No portal venous gas. There is no adenopathy. Reproductive: The uterus is retroflexed.  No adnexal masses. Other: None Musculoskeletal: No acute or significant osseous findings. IMPRESSION: 1. No acute intra-abdominal or pelvic pathology. No hydronephrosis or nephrolithiasis. 2. Cholelithiasis. 3. Moderate colonic stool burden. No bowel obstruction. Normal appendix. Electronically Signed   By: Anner Crete M.D.   On: 04/17/2022 22:19   US Abdomen Limited RUQ (LIVER/GB)  Result Date: 04/17/2022 CLINICAL DATA:  Initial evaluation for acute right upper quadrant pain. EXAM: ULTRASOUND ABDOMEN LIMITED RIGHT UPPER QUADRANT COMPARISON:  None available. FINDINGS: Gallbladder: 5 mm echogenic stone seen at the gallbladder neck. Gallbladder wall measures within normal limits at 2.7 mm. No free pericholecystic fluid. No sonographic Murphy sign elicited on exam. Common bile duct: Diameter: 1.3 mm Liver: No focal lesion identified. Increased echogenicity seen within the hepatic parenchyma. Portal vein is patent on color Doppler imaging with normal direction of blood flow towards the liver. Other: None. IMPRESSION: 1. Cholelithiasis. No sonographic  features to suggest acute cholecystitis. 2. No biliary dilatation. 3. Increased echogenicity within the hepatic parenchyma, suggesting steatosis. Electronically Signed   By: Jeannine Boga M.D.   On: 04/17/2022 21:03    Procedures Procedures    Medications Ordered in ED Medications  morphine (PF) 4 MG/ML injection 4 mg (4 mg Intravenous Given 04/17/22 2031)  ondansetron (ZOFRAN) injection 4 mg (4 mg Intravenous Given 04/17/22 2031)  morphine (PF) 4 MG/ML injection 4 mg (4 mg Intravenous Given 04/17/22 2225)  ketorolac (TORADOL) 15 MG/ML injection 15 mg (15 mg Intravenous Given 04/17/22 2227)    ED Course/ Medical Decision Making/ A&P                           Medical Decision Making Amount and/or  Complexity of Data Reviewed Labs: ordered. Radiology: ordered.  Risk Prescription drug management.   This patient presents to the ED for concern of abdominal pain, this involves an extensive number of treatment options, and is a complaint that carries with it a high risk of complications and morbidity.  The differential diagnosis includes cholecystitis, pancreatitis, hepatitis, myocardial infarction, ascending cholangitis, appendicitis, ovarian torsion   Co morbidities that complicate the patient evaluation  Obesity    Lab Tests:  I Ordered, and personally interpreted labs.  The pertinent results include: No acute abnormalities on lab test ordered   Imaging Studies ordered:  I ordered imaging studies including right upper quadrant ultrasound and CT abdomen pelvis without contrast I independently visualized and interpreted imaging which showed  Right upper quadrant ultrasound: 5 mm echogenic stone found at the bladder neck, hepatic parenchymal changes suggestive of steatosis CT abdomen pelvis: Cholelithiasis and moderate stool burden. I agree with the radiologist interpretation   Cardiac Monitoring: / EKG:  The patient was maintained on a cardiac monitor.  I personally viewed and interpreted the cardiac monitored which showed an underlying rhythm of: Normal sinus rhythm   Consultations Obtained:  N/a   Problem List / ED Course / Critical interventions / Medication management  Cholelithiasis I ordered medication including  Morphine for pain x2 Toradol for pain Zofran for nausea  Reevaluation of the patient after these medicines showed that the patient improved I have reviewed the patients home medicines and have made adjustments as needed   Test / Admission - Considered:  Cholelithiasis Vital signs within normal range and stable throughout visit Laboratory studies insignificant for any acute pathology Imaging studies significant for cholelithiasis as an acute  process.  Patient was educated regarding pathology of disease process.  She acknowledge understanding.  I discussed with her potential treatment options and she elected to be discharged with adequate pain control.  She denies narcotic medications to be prescribed so I prescribed ibuprofen as needed for pain.  I also recommended to get surgery involved in her case if pain cannot be controlled but she declined.  She elected for discharge when given all the options. I discussed at length with the patient regarding incidental finding of hepatic steatosis changes on ultrasound.  She seems most concerned with these findings.  I recommended close follow-up with her PCP/GI doctor for further management of the disease process. Worrisome signs and symptoms were discussed with the patient and the patient knowledge understanding to return to the emergency department if noticed.  Patient was stable upon discharge.         Final Clinical Impression(s) / ED Diagnoses Final diagnoses:  Calculus of gallbladder without cholecystitis without obstruction  Rx / DC Orders ED Discharge Orders          Ordered    ibuprofen (ADVIL) 600 MG tablet  Every 6 hours PRN        04/17/22 2312              Wilnette Kales, [place] 04/18/22 0018    Luna Fuse, MD 04/28/22 2243

## 2022-04-17 NOTE — ED Notes (Signed)
PA at the Bedside. ?

## 2022-04-17 NOTE — ED Triage Notes (Signed)
Pt reports severe abdominal pain on rt.side that radiates to her rt. Flank. ?+ nausea  ?Denies vomiting or diarrhea ?

## 2022-04-17 NOTE — Discharge Instructions (Addendum)
I will attach information regarding gallbladder stones (cholelithiasis).  Also attach information for hepatic steatosis.  I sent in some medicine to take as needed for pain into [facility].  Look for the worrisome signs and symptoms we discussed and please do not hesitate to return to the emergency department if noticed. ?

## 2022-07-31 ENCOUNTER — Ambulatory Visit: Payer: Self-pay | Admitting: Podiatry

## 2023-01-18 ENCOUNTER — Ambulatory Visit
Admission: EM | Admit: 2023-01-18 | Discharge: 2023-01-18 | Disposition: A | Discharge status: Home / Self Care | Payer: BC Managed Care – PPO | Attending: Urgent Care | Admitting: Urgent Care

## 2023-01-18 DIAGNOSIS — L03213 Periorbital cellulitis: Secondary | ICD-10-CM

## 2023-01-18 MED ORDER — CEFDINIR 300 MG PO CAPS
300.0000 mg | ORAL_CAPSULE | Freq: Two times a day (BID) | ORAL | 0 refills | Status: AC
Start: 2023-01-18 — End: ?

## 2023-01-18 MED ORDER — FLUCONAZOLE 150 MG PO TABS: 150.0000 mg | ORAL_TABLET | ORAL | 0 refills | Status: AC

## 2023-01-18 NOTE — ED Triage Notes (Signed)
Pt c/o redness, swelling, pain to right upper eyelid x 2-3 hours-states pain started while eating-NAD-steady gait

## 2023-01-18 NOTE — ED Provider Notes (Signed)
  [facility]  Note:  This document was prepared using Systems analyst and may include unintentional dictation errors.  MRN: [id number] DOB: 28-Jan-1996  Subjective:   Joanna Long is a 27 y.o. female presenting for 1 day history of acute onset persistent right eyelid pain with swelling.  No vision changes, eye trauma, photophobia.  No itching, rashes.  No current facility-administered medications for this encounter.  Current Outpatient Medications:    Efinaconazole 10 % SOLN, Apply 1 drop topically daily., Disp: 4 mL, Rfl: 11   ibuprofen (ADVIL) 600 MG tablet, Take 1 tablet (600 mg total) by mouth every 6 (six) hours as needed., Disp: 30 tablet, Rfl: 0   Allergies  Allergen Reactions   Soy Allergy Itching    HIVES    History reviewed. No pertinent past medical history.   History reviewed. No pertinent surgical history.  No family history on file.  Social History   Tobacco Use   Smoking status: Never   Smokeless tobacco: Never  Vaping Use   Vaping Use: Never used  Substance Use Topics   Alcohol use: No   Drug use: No    ROS   Objective:   Vitals: BP 121/81 (BP Location: Left Arm)   Pulse 83   Temp 98.9 F (37.2 C) (Oral)   Resp 18   LMP 01/06/2023   SpO2 98%   Physical Exam Constitutional:      General: She is not in acute distress.    Appearance: Normal appearance. She is well-developed. She is not ill-appearing, toxic-appearing or diaphoretic.  HENT:     Head: Normocephalic and atraumatic.     Nose: Nose normal.     Mouth/Throat:     Mouth: Mucous membranes are moist.  Eyes:     General: Lids are everted, no foreign bodies appreciated. Vision grossly intact. No scleral icterus.       Right eye: No foreign body, discharge or hordeolum.        Left eye: No foreign body, discharge or hordeolum.     Extraocular Movements: Extraocular movements intact.     Right eye: Normal extraocular motion.      Left eye: Normal extraocular motion and no nystagmus.     Conjunctiva/sclera:     Right eye: Right conjunctiva is not injected. No chemosis, exudate or hemorrhage.    Left eye: Left conjunctiva is not injected. No chemosis, exudate or hemorrhage.  Cardiovascular:     Rate and Rhythm: Normal rate.  Pulmonary:     Effort: Pulmonary effort is normal.  Skin:    General: Skin is warm and dry.  Neurological:     General: No focal deficit present.     Mental Status: She is alert and oriented to person, place, and time.  Psychiatric:        Mood and Affect: Mood normal.        Behavior: Behavior normal.     Assessment and Plan :   PDMP not reviewed this encounter.  1. Preseptal cellulitis of right upper eyelid     Physical exam findings consistent with preseptal cellulitis and recommended antibiotic use for this.  She gets yeast infections with antibiotic use and therefore will use fluconazole to address that.  Ibuprofen for pain relief. Counseled patient on potential for adverse effects with medications prescribed/recommended today, ER and return-to-clinic precautions discussed, patient verbalized understanding.    Jaynee Eagles, [place] 01/19/23 [phone]

## 2024-01-09 ENCOUNTER — Encounter: Payer: Self-pay | Admitting: Nurse Practitioner
